# Patient Record
Sex: Female | Born: 1980 | Race: White | Hispanic: No | Marital: Single | Smoking: Current every day smoker
Health system: Southern US, Community
[De-identification: ages and names within clinical notes are randomized; demographics above are authoritative.]

## PROBLEM LIST (undated history)

## (undated) DIAGNOSIS — J45909 Unspecified asthma, uncomplicated: Secondary | ICD-10-CM

## (undated) HISTORY — PX: CHOLECYSTECTOMY: SHX55

## (undated) HISTORY — PX: LAPAROSCOPIC GASTRIC SLEEVE RESECTION: SHX5895

---

## 2013-06-24 ENCOUNTER — Emergency Department: Payer: Self-pay | Admitting: Emergency Medicine

## 2013-06-24 LAB — CBC
HCT: 43.5 % (ref 35.0–47.0)
HGB: 14.8 g/dL (ref 12.0–16.0)
MCH: 30.2 pg (ref 26.0–34.0)
MCHC: 33.9 g/dL (ref 32.0–36.0)
MCV: 89 fL (ref 80–100)
Platelet: 280 10*3/uL (ref 150–440)
RBC: 4.89 10*6/uL (ref 3.80–5.20)
RDW: 13.2 % (ref 11.5–14.5)
WBC: 8.8 10*3/uL (ref 3.6–11.0)

## 2013-06-24 LAB — BASIC METABOLIC PANEL
ANION GAP: 4 — AB (ref 7–16)
BUN: 11 mg/dL (ref 7–18)
CO2: 29 mmol/L (ref 21–32)
CREATININE: 0.64 mg/dL (ref 0.60–1.30)
Calcium, Total: 9 mg/dL (ref 8.5–10.1)
Chloride: 108 mmol/L — ABNORMAL HIGH (ref 98–107)
EGFR (African American): >60
Glucose: 88 mg/dL (ref 65–99)
OSMOLALITY: 280 (ref 275–301)
Potassium: 4.3 mmol/L (ref 3.5–5.1)
Sodium: 141 mmol/L (ref 136–145)

## 2013-06-24 LAB — URINALYSIS, COMPLETE
Bacteria: NONE SEEN
GLUCOSE, UR: NEGATIVE mg/dL (ref 0–75)
Leukocyte Esterase: NEGATIVE
NITRITE: NEGATIVE
PH: 7 (ref 4.5–8.0)
Protein: NEGATIVE
RBC,UR: 2 /HPF (ref 0–5)
SPECIFIC GRAVITY: 1.023 (ref 1.003–1.030)
Squamous Epithelial: 19

## 2013-10-31 ENCOUNTER — Emergency Department: Payer: Self-pay | Admitting: Emergency Medicine

## 2013-10-31 LAB — CBC WITH DIFFERENTIAL/PLATELET
Basophil #: 0.1 10*3/uL (ref 0.0–0.1)
Basophil %: 1.9 %
EOS PCT: 1.4 %
Eosinophil #: 0.1 10*3/uL (ref 0.0–0.7)
HCT: 44.5 % (ref 35.0–47.0)
HGB: 14.7 g/dL (ref 12.0–16.0)
LYMPHS ABS: 1.6 10*3/uL (ref 1.0–3.6)
Lymphocyte %: 21.1 %
MCH: 30.1 pg (ref 26.0–34.0)
MCHC: 33.1 g/dL (ref 32.0–36.0)
MCV: 91 fL (ref 80–100)
MONOS PCT: 8 %
Monocyte #: 0.6 x10 3/mm (ref 0.2–0.9)
NEUTROS ABS: 5.2 10*3/uL (ref 1.4–6.5)
NEUTROS PCT: 67.6 %
Platelet: 239 10*3/uL (ref 150–440)
RBC: 4.89 10*6/uL (ref 3.80–5.20)
RDW: 14.8 % — AB (ref 11.5–14.5)
WBC: 7.7 10*3/uL (ref 3.6–11.0)

## 2013-10-31 LAB — COMPREHENSIVE METABOLIC PANEL
ANION GAP: 8 (ref 7–16)
Albumin: 3.6 g/dL (ref 3.4–5.0)
Alkaline Phosphatase: 72 U/L
BUN: 12 mg/dL (ref 7–18)
Bilirubin,Total: 0.6 mg/dL (ref 0.2–1.0)
Calcium, Total: 9.2 mg/dL (ref 8.5–10.1)
Chloride: 105 mmol/L (ref 98–107)
Co2: 29 mmol/L (ref 21–32)
Creatinine: 0.84 mg/dL (ref 0.60–1.30)
EGFR (African American): >60
EGFR (Non-African Amer.): >60
Glucose: 86 mg/dL (ref 65–99)
Osmolality: 282 (ref 275–301)
Potassium: 4.1 mmol/L (ref 3.5–5.1)
SGOT(AST): 16 U/L (ref 15–37)
SGPT (ALT): 19 U/L
Sodium: 142 mmol/L (ref 136–145)
Total Protein: 7.3 g/dL (ref 6.4–8.2)

## 2013-10-31 LAB — URINALYSIS, COMPLETE
BACTERIA: NONE SEEN
Bilirubin,UR: NEGATIVE
Blood: NEGATIVE
Glucose,UR: NEGATIVE mg/dL (ref 0–75)
Ketone: NEGATIVE
Leukocyte Esterase: NEGATIVE
Nitrite: NEGATIVE
PROTEIN: NEGATIVE
Ph: 6 (ref 4.5–8.0)
RBC,UR: 1 /HPF (ref 0–5)
Specific Gravity: 1.021 (ref 1.003–1.030)
Squamous Epithelial: 4
WBC UR: 2 /HPF (ref 0–5)

## 2013-10-31 LAB — LIPASE, BLOOD: Lipase: 64 U/L — ABNORMAL LOW (ref 73–393)

## 2014-05-14 ENCOUNTER — Emergency Department: Payer: Self-pay | Admitting: Emergency Medicine

## 2014-10-16 ENCOUNTER — Encounter: Payer: Self-pay | Admitting: Emergency Medicine

## 2014-10-16 DIAGNOSIS — Z720 Tobacco use: Secondary | ICD-10-CM | POA: Insufficient documentation

## 2014-10-16 DIAGNOSIS — M79632 Pain in left forearm: Secondary | ICD-10-CM | POA: Diagnosis present

## 2014-10-16 NOTE — ED Notes (Signed)
Pt presents to ER alert and in NAD. Pt has redness and slight swelling noted to left forearm. pt states she had a cyst for several months and she was pushing on it and it "went down" several days ago and is now having pain to area.

## 2014-10-17 ENCOUNTER — Emergency Department
Admission: EM | Admit: 2014-10-17 | Discharge: 2014-10-17 | Discharge status: Against Medical Advice | Payer: Medicaid Other | Attending: Emergency Medicine | Admitting: Emergency Medicine

## 2014-12-11 ENCOUNTER — Emergency Department
Admission: EM | Admit: 2014-12-11 | Discharge: 2014-12-11 | Disposition: A | Discharge status: Home / Self Care | Payer: Medicaid Other | Attending: Emergency Medicine | Admitting: Emergency Medicine

## 2014-12-11 ENCOUNTER — Encounter: Payer: Self-pay | Admitting: Emergency Medicine

## 2014-12-11 DIAGNOSIS — K029 Dental caries, unspecified: Secondary | ICD-10-CM | POA: Diagnosis not present

## 2014-12-11 DIAGNOSIS — K047 Periapical abscess without sinus: Secondary | ICD-10-CM | POA: Diagnosis not present

## 2014-12-11 DIAGNOSIS — Z720 Tobacco use: Secondary | ICD-10-CM | POA: Diagnosis not present

## 2014-12-11 DIAGNOSIS — K088 Other specified disorders of teeth and supporting structures: Secondary | ICD-10-CM | POA: Insufficient documentation

## 2014-12-11 DIAGNOSIS — K0889 Other specified disorders of teeth and supporting structures: Secondary | ICD-10-CM

## 2014-12-11 MED ORDER — HYDROCODONE-ACETAMINOPHEN 5-325 MG PO TABS: 1.0000 | ORAL_TABLET | ORAL | Status: DC | PRN

## 2014-12-11 MED ORDER — LIDOCAINE HCL (PF) 1 % IJ SOLN
2.0000 mL | Freq: Once | INTRAMUSCULAR | Status: AC
  Administered 2014-12-11: 2 mL
  Filled 2014-12-11: qty 5

## 2014-12-11 MED ORDER — BUPIVACAINE HCL (PF) 0.5 % IJ SOLN
10.0000 mL | Freq: Once | INTRAMUSCULAR | Status: AC
  Administered 2014-12-11: 10 mL
  Filled 2014-12-11: qty 30

## 2014-12-11 MED ORDER — AMOXICILLIN-POT CLAVULANATE 875-125 MG PO TABS: 1.0000 | ORAL_TABLET | Freq: Two times a day (BID) | ORAL | Status: DC

## 2014-12-11 NOTE — Discharge Instructions (Signed)

## 2014-12-11 NOTE — ED Notes (Signed)
Pt had dental surgery 10 days ago, using Abx,  But swelling and pain has increased.

## 2014-12-11 NOTE — ED Provider Notes (Signed)
CSN: [id number]     Arrival date & time 12/11/14  1657 History   First MD Initiated Contact with Patient 12/11/14 1740     Chief Complaint  Patient presents with  . Dental Pain     (Consider location/radiation/quality/duration/timing/severity/associated sxs/prior Treatment) Patient is a 34 y.o. female presenting with tooth pain.  Dental Pain Associated symptoms: no congestion, no fever and no headaches     Mercedes Mendoza is a 34 y.o. female presenting with dental pain, swelling, and bleeding. Patient had tooth #31 extracted last Wednesday 12/02/2014 at [facility]. She tolerated the procedure well, but states that "they had to do a lot to get the tooth out." Six stitches were placed during the procedure. She was given a couple days worth of Norco and the pain was well tolerated. She was also given a 10 day course of Amoxicillin TID.   2-3 days ago she noticed swelling to the right side of her face near the jaw. She has also experienced more pain in the last 1-2 days at the site of extraction and along her right jaw. Pain is described as 8/10 throbbing pain made worse with eating, drinking, and touching. The pain is only moderately improved with 800 mg Ibuprofen, which she has taken daily for the last 3 days. She had minor bleeding this morning from the site of extraction. Patient is otherwise feeling healthy and denies fever, chills, SOB, and chest pain.   History reviewed. No pertinent past medical history. Past Surgical History  Procedure Laterality Date  . Cesarean section    . Cholecystectomy    . Laparoscopic gastric sleeve resection     No family history on file. Social History  Substance Use Topics  . Smoking status: Current Every Day Smoker -- 0.50 packs/day    Types: Cigarettes  . Smokeless tobacco: None  . Alcohol Use: No   OB History    No data available     Review of Systems  Constitutional: Positive for fatigue (not sleeping well due to the pain).  Negative for fever, chills, diaphoresis and activity change.  HENT: Positive for dental problem. Negative for congestion and rhinorrhea.   Eyes: Negative for discharge.  Respiratory: Negative for chest tightness and shortness of breath.   Cardiovascular: Negative for chest pain.  Gastrointestinal: Negative for nausea, vomiting, abdominal pain, diarrhea and constipation.  Genitourinary: Negative for dysuria and difficulty urinating.  Musculoskeletal: Negative for myalgias and neck stiffness.  Skin: Negative for color change and rash.  Neurological: Negative for dizziness, light-headedness and headaches.      Allergies  Codeine and Tramadol  Home Medications   Prior to Admission medications   Not on File   BP 128/68 mmHg  Pulse 80  Temp(Src) 98.2 F (36.8 C) (Oral)  Resp 16  Ht 5\' 7"  (1.702 m)  Wt 220 lb (99.791 kg)  BMI 34.45 kg/m2  SpO2 99%  LMP 11/10/2014 (Approximate) Physical Exam  Constitutional: She is oriented to person, place, and time. She appears well-developed and well-nourished. No distress.  HENT:  Head: Normocephalic and atraumatic.  Right Ear: External ear normal.  Left Ear: External ear normal.  Nose: Nose normal.  Mouth/Throat: Uvula is midline and oropharynx is clear and moist. No oral lesions. No trismus in the jaw. Normal dentition. Dental caries present. No dental abscesses or uvula swelling.    Teeth #30 and #31 extracted. Pain on palpation of right lower jaw and inside right side of mouth. Small amount of white  discharge noted at area of extraction without blood. Right lower face is mildly swollen without erythema or ecchymosis.   Eyes: Conjunctivae and EOM are normal.  Neck: Normal range of motion. Neck supple.  Cardiovascular: Normal rate and regular rhythm.  Exam reveals no gallop and no friction rub.   No murmur heard. Pulmonary/Chest: Effort normal and breath sounds normal. No respiratory distress.  Lymphadenopathy:    She has no cervical  adenopathy.  Neurological: She is alert and oriented to person, place, and time.  Skin: Skin is warm and dry. She is not diaphoretic.  Psychiatric: She has a normal mood and affect. Her behavior is normal. Thought content normal.  Nursing note and vitals reviewed.   ED Course  Procedures (including critical care time) NERVE BLOCK Performed by: Patience Musca Consent: Verbal consent obtained. Required items: required blood products, implants, devices, and special equipment available Time out: Immediately prior to procedure a "time out" was called to verify the correct patient, procedure, equipment, support staff and site/side marked as required.  Indication: Right-sided facial pain  Nerve block body site: Right inferior alveolar nerve   Preparation: Patient was prepped and draped in the usual sterile fashion. Needle gauge: 25gauge  1.5 inch  Location technique: anatomical landmarks  Local anesthetic: There are 0.5% bupivacaine, 5 cc. 1% lidocaine, 5 cc   Anesthetic total: 10 ml  Outcome: pain improved Patient tolerance: Patient tolerated the procedure well with no immediate complications. Pain immediately improved.  Labs Review Labs Reviewed - No data to display  Imaging Review No results found. I have personally reviewed and evaluated these images and lab results as part of my medical decision-making.   EKG Interpretation None      MDM   Final diagnoses:  Pain, dental  Dental infection    34 year old female with right sided tooth pain and facial swelling. No active bleeding. No visible abscess or fluctuance. Patient given a right inferior alveolar nerve block which significantly improved her right lower jaw pain. Augmentin twice a day 7 days. Follow-up with [facility] dental clinic first of next week. Return to ER for any worsening symptoms urgent changes in her health.       Evon Slack, PA-C 12/11/14 1834  Minna Antis, MD 12/11/14 [phone]

## 2014-12-11 NOTE — ED Notes (Signed)
Patient presents to the ED with pain, swelling, and bleeding in an area where she had a tooth pulled last Wednesday.  Patient states she has been taking amoxicillin since the procedure but pain and swelling have been getting worse the past two to three days.

## 2014-12-14 ENCOUNTER — Emergency Department
Admission: EM | Admit: 2014-12-14 | Discharge: 2014-12-14 | Disposition: A | Discharge status: Home / Self Care | Payer: Medicaid Other | Attending: Emergency Medicine | Admitting: Emergency Medicine

## 2014-12-14 DIAGNOSIS — K088 Other specified disorders of teeth and supporting structures: Secondary | ICD-10-CM | POA: Insufficient documentation

## 2014-12-14 DIAGNOSIS — K0889 Other specified disorders of teeth and supporting structures: Secondary | ICD-10-CM

## 2014-12-14 DIAGNOSIS — Z720 Tobacco use: Secondary | ICD-10-CM | POA: Insufficient documentation

## 2014-12-14 MED ORDER — HYDROCODONE-ACETAMINOPHEN 5-325 MG PO TABS
1.0000 | ORAL_TABLET | Freq: Once | ORAL | Status: AC
  Administered 2014-12-14: 1 via ORAL
  Filled 2014-12-14: qty 1

## 2014-12-14 MED ORDER — HYDROCODONE-ACETAMINOPHEN 5-325 MG PO TABS: 1.0000 | ORAL_TABLET | ORAL | Status: DC | PRN

## 2014-12-14 NOTE — Discharge Instructions (Signed)

## 2014-12-14 NOTE — ED Provider Notes (Signed)
[facility] Emergency Department Provider Note  ____________________________________________  Time seen: On arrival  I have reviewed the triage vital signs and the nursing notes.   HISTORY  Chief Complaint Dental Pain    HPI Mercedes Mendoza is a 34 y.o. female who presents with dental pain. She had a procedure done at [facility] on 12/02/2014 where she had a tooth extracted. She has had continued discomfort in the area. She has a follow-up with her dentist's next week. She denies fevers chills. She started on antibiotics. She has run out of pain medication requests refill. No difficulty swallowing, no intraoral swelling, no neck pain    No past medical history on file.  There are no active problems to display for this patient.   Past Surgical History  Procedure Laterality Date  . Cesarean section    . Cholecystectomy    . Laparoscopic gastric sleeve resection      Current Outpatient Rx  Name  Route  Sig  Dispense  Refill  . amoxicillin-clavulanate (AUGMENTIN) 875-125 MG per tablet   Oral   Take 1 tablet by mouth every 12 (twelve) hours. 7 days   14 tablet   0   . HYDROcodone-acetaminophen (NORCO/VICODIN) 5-325 MG per tablet   Oral   Take 1 tablet by mouth every 4 (four) hours as needed for moderate pain.   20 tablet   0     Allergies Codeine and Tramadol  No family history on file.  Social History Social History  Substance Use Topics  . Smoking status: Current Every Day Smoker -- 0.50 packs/day    Types: Cigarettes  . Smokeless tobacco: Not on file  . Alcohol Use: No    Review of Systems  Constitutional: Negative for fever. Eyes: Negative for visual changes. ENT: Negative for sore throat   Genitourinary: Negative for dysuria. Musculoskeletal: Negative for back pain. Skin: Negative for rash. Neurological: Negative for headaches or focal weakness   ____________________________________________   PHYSICAL EXAM:  VITAL  SIGNS: ED Triage Vitals  Enc Vitals Group     BP 12/14/14 1738 132/69 mmHg     Pulse Rate 12/14/14 1738 71     Resp --      Temp 12/14/14 1738 98.1 F (36.7 C)     Temp Source 12/14/14 1738 Oral     SpO2 12/14/14 1738 98 %     Weight --      Height --      Head Cir --      Peak Flow --      Pain Score 12/14/14 1812 7     Pain Loc --      Pain Edu? --      Excl. in GC? --      Constitutional: Alert and oriented. Well appearing and in no distress. Eyes: Conjunctivae are normal.  ENT   Head: Normocephalic and atraumatic.   Mouth/Throat: Mucous membranes are moist. No erythema or abscess surrounding extracted tooth. No discharge, mild tenderness to palpation. Normal oropharynx Cardiovascular: Normal rate, regular rhythm.  Respiratory: Normal respiratory effort without tachypnea nor retractions.  Gastrointestinal: Soft and non-tender in all quadrants. No distention. There is no CVA tenderness. Musculoskeletal: Nontender with normal range of motion in all extremities. Neurologic:  Normal speech and language. No gross focal neurologic deficits are appreciated. Skin:  Skin is warm, dry and intact. No rash noted. Psychiatric: Mood and affect are normal. Patient exhibits appropriate insight and judgment.  ____________________________________________    LABS (pertinent positives/negatives)  Labs Reviewed - No data to display  ____________________________________________     ____________________________________________    RADIOLOGY I have personally reviewed any xrays that were ordered on this patient: None  ____________________________________________   PROCEDURES  Procedure(s) performed: none   ____________________________________________   INITIAL IMPRESSION / ASSESSMENT AND PLAN / ED COURSE  Pertinent labs & imaging results that were available during my care of the patient were reviewed by me and considered in my medical decision making (see chart for  details).  Patient well-appearing and in no distress. Afebrile, relatively benign exam. She requires follow-up with her dentist. She is on antibiotics. I'll refill her pain medications I've asked her to follow-up closely this week. Return precautions discussed  ____________________________________________   FINAL CLINICAL IMPRESSION(S) / ED DIAGNOSES  Final diagnoses:  Pain, dental     Jene Every, MD 12/14/14 [phone]

## 2014-12-23 ENCOUNTER — Emergency Department
Admission: EM | Admit: 2014-12-23 | Discharge: 2014-12-23 | Disposition: A | Discharge status: Home / Self Care | Payer: Medicaid Other | Attending: Emergency Medicine | Admitting: Emergency Medicine

## 2014-12-23 DIAGNOSIS — K088 Other specified disorders of teeth and supporting structures: Secondary | ICD-10-CM | POA: Diagnosis present

## 2014-12-23 DIAGNOSIS — K0889 Other specified disorders of teeth and supporting structures: Secondary | ICD-10-CM

## 2014-12-23 DIAGNOSIS — Z720 Tobacco use: Secondary | ICD-10-CM | POA: Insufficient documentation

## 2014-12-23 MED ORDER — SODIUM CHLORIDE 0.9 % IV BOLUS (SEPSIS): 1000.0000 mL | Freq: Once | INTRAVENOUS | Status: DC

## 2014-12-23 MED ORDER — HYDROCODONE-ACETAMINOPHEN 5-325 MG PO TABS: 1.0000 | ORAL_TABLET | ORAL | Status: DC | PRN

## 2014-12-23 MED ORDER — NAPROXEN 500 MG PO TBEC: 500.0000 mg | DELAYED_RELEASE_TABLET | Freq: Two times a day (BID) | ORAL | Status: DC

## 2014-12-23 NOTE — ED Notes (Signed)
Pt reports that she had a tooth pulled 2 weeks ago and apparently still has stitches. She states that she ate a chip tonight and thinks stitches may have dislodged. Some bleeding in mouth.

## 2014-12-23 NOTE — Discharge Instructions (Signed)

## 2014-12-23 NOTE — ED Provider Notes (Signed)
[facility] Emergency Department Provider Note  ____________________________________________  Time seen: Approximately 5:26 PM  I have reviewed the triage vital signs and the nursing notes.   HISTORY  Chief Complaint Dental Pain    HPI Mercedes Mendoza is a 34 y.o. female who presents with dental pain. She had a procedure done at [facility] on 12/02/2014 where she had a tooth extracted. She has had continued discomfort in the area. She has a follow-up with her dentist's next week on 12/30/2014.Marland Kitchen She denies fevers chills. She has recently finished her antibiotics. She has run out of pain medication requests refill. No difficulty swallowing, no intraoral swelling, no neck pain. Patient was seen here on 9/16 and 9/19 for the same.Previous notes and [place] controlled substance registry reviewed.   No past medical history on file.  There are no active problems to display for this patient.   Past Surgical History  Procedure Laterality Date  . Cesarean section    . Cholecystectomy    . Laparoscopic gastric sleeve resection      Current Outpatient Rx  Name  Route  Sig  Dispense  Refill  . HYDROcodone-acetaminophen (NORCO) 5-325 MG tablet   Oral   Take 1-2 tablets by mouth every 4 (four) hours as needed for moderate pain.   15 tablet   0   . naproxen (EC NAPROSYN) 500 MG EC tablet   Oral   Take 1 tablet (500 mg total) by mouth 2 (two) times daily with a meal.   60 tablet   0     Allergies Codeine and Tramadol  No family history on file.  Social History Social History  Substance Use Topics  . Smoking status: Current Every Day Smoker -- 0.50 packs/day    Types: Cigarettes  . Smokeless tobacco: Not on file  . Alcohol Use: No    Review of Systems Constitutional: No fever/chills Eyes: No visual changes. ENT: Positive for right up dental gum pain. Cardiovascular: Denies chest pain. Respiratory: Denies shortness of breath. Gastrointestinal:  No abdominal pain.  No nausea, no vomiting.  No diarrhea.  No constipation. Genitourinary: Negative for dysuria. Musculoskeletal: Negative for back pain. Skin: Negative for rash. Neurological: Negative for headaches, focal weakness or numbness.  10-point ROS otherwise negative.  ____________________________________________   PHYSICAL EXAM:  VITAL SIGNS: ED Triage Vitals  Enc Vitals Group     BP 12/23/14 1657 139/99 mmHg     Pulse Rate 12/23/14 1657 85     Resp --      Temp 12/23/14 1657 98.4 F (36.9 C)     Temp Source 12/23/14 1657 Oral     SpO2 12/23/14 1657 99 %     Weight 12/23/14 1657 220 lb (99.791 kg)     Height 12/23/14 1657  (1.702 m)     Head Cir --      Peak Flow --      Pain Score 12/23/14 1657 6     Pain Loc --      Pain Edu? --      Excl. in GC? --     Constitutional: Alert and oriented. Well appearing and in no acute distress. Eyes: Conjunctivae are normal. PERRL. EOMI. Head: Atraumatic. Nose: No congestion/rhinnorhea. Mouth/Throat: Mucous membranes are moist.  Oropharynx non-erythematous. No evidence of infection. No active bleeding noted. Tenderness to the area noted. Neck: No stridor.   Cardiovascular: Normal rate, regular rhythm. Grossly normal heart sounds.  Good peripheral circulation. Respiratory: Normal respiratory effort.  No retractions. Lungs CTAB. Gastrointestinal: Soft and nontender. No distention. No abdominal bruits. No CVA tenderness. Musculoskeletal: No lower extremity tenderness nor edema.  No joint effusions. Neurologic:  Normal speech and language. No gross focal neurologic deficits are appreciated. No gait instability. Skin:  Skin is warm, dry and intact. No rash noted. Psychiatric: Mood and affect are normal. Speech and behavior are normal.  ____________________________________________   LABS (all labs ordered are listed, but only abnormal results are displayed)  Labs Reviewed - No data to  display ____________________________________________   PROCEDURES  Procedure(s) performed: None  Critical Care performed: No  ____________________________________________   INITIAL IMPRESSION / ASSESSMENT AND PLAN / ED COURSE  Pertinent labs & imaging results that were available during my care of the patient were reviewed by me and considered in my medical decision making (see chart for details).  Continued dental pain. Encouraged patient to follow up with her dentist next week on October 5's noted above. Patient instructed that we would no longer be able to give her controlled drug for pain since this is becoming chronic. Rx given for Norco 5/325 #20. Viscous lidocaine to be applied by Q-tip to the gum area. ____________________________________________   FINAL CLINICAL IMPRESSION(S) / ED DIAGNOSES  Final diagnoses:  Pain, dental      Evangeline Dakin, PA-C 12/23/14 1740  Sharyn Creamer, MD 01/01/15 [phone]

## 2015-02-15 ENCOUNTER — Emergency Department: Payer: No Typology Code available for payment source

## 2015-02-15 ENCOUNTER — Emergency Department
Admission: EM | Admit: 2015-02-15 | Discharge: 2015-02-15 | Disposition: A | Discharge status: Home / Self Care | Payer: No Typology Code available for payment source | Attending: Emergency Medicine | Admitting: Emergency Medicine

## 2015-02-15 ENCOUNTER — Encounter: Payer: Self-pay | Admitting: Medical Oncology

## 2015-02-15 DIAGNOSIS — Y9389 Activity, other specified: Secondary | ICD-10-CM | POA: Diagnosis not present

## 2015-02-15 DIAGNOSIS — S20219A Contusion of unspecified front wall of thorax, initial encounter: Secondary | ICD-10-CM

## 2015-02-15 DIAGNOSIS — Y998 Other external cause status: Secondary | ICD-10-CM | POA: Insufficient documentation

## 2015-02-15 DIAGNOSIS — S301XXA Contusion of abdominal wall, initial encounter: Secondary | ICD-10-CM

## 2015-02-15 DIAGNOSIS — Y92410 Unspecified street and highway as the place of occurrence of the external cause: Secondary | ICD-10-CM | POA: Insufficient documentation

## 2015-02-15 DIAGNOSIS — Z3202 Encounter for pregnancy test, result negative: Secondary | ICD-10-CM | POA: Diagnosis not present

## 2015-02-15 DIAGNOSIS — S299XXA Unspecified injury of thorax, initial encounter: Secondary | ICD-10-CM | POA: Diagnosis present

## 2015-02-15 DIAGNOSIS — S40011A Contusion of right shoulder, initial encounter: Secondary | ICD-10-CM | POA: Diagnosis not present

## 2015-02-15 DIAGNOSIS — F17210 Nicotine dependence, cigarettes, uncomplicated: Secondary | ICD-10-CM | POA: Insufficient documentation

## 2015-02-15 HISTORY — DX: Unspecified asthma, uncomplicated: J45.909

## 2015-02-15 LAB — URINALYSIS COMPLETE WITH MICROSCOPIC (ARMC ONLY)
Bilirubin Urine: NEGATIVE
Glucose, UA: NEGATIVE mg/dL
HGB URINE DIPSTICK: NEGATIVE
Leukocytes, UA: NEGATIVE
Nitrite: NEGATIVE
PH: 5 (ref 5.0–8.0)
PROTEIN: NEGATIVE mg/dL
Specific Gravity, Urine: 1.029 (ref 1.005–1.030)

## 2015-02-15 LAB — COMPREHENSIVE METABOLIC PANEL
ALBUMIN: 4.2 g/dL (ref 3.5–5.0)
ALT: 12 U/L — ABNORMAL LOW (ref 14–54)
ANION GAP: 7 (ref 5–15)
AST: 13 U/L — AB (ref 15–41)
Alkaline Phosphatase: 43 U/L (ref 38–126)
BUN: 18 mg/dL (ref 6–20)
CHLORIDE: 105 mmol/L (ref 101–111)
CO2: 28 mmol/L (ref 22–32)
Calcium: 9.3 mg/dL (ref 8.9–10.3)
Creatinine, Ser: 0.6 mg/dL (ref 0.44–1.00)
GFR calc Af Amer: >60 mL/min (ref >60)
GFR calc non Af Amer: >60 mL/min (ref >60)
GLUCOSE: 97 mg/dL (ref 65–99)
POTASSIUM: 3.8 mmol/L (ref 3.5–5.1)
Sodium: 140 mmol/L (ref 135–145)
Total Bilirubin: 0.9 mg/dL (ref 0.3–1.2)
Total Protein: 6.6 g/dL (ref 6.5–8.1)

## 2015-02-15 LAB — CBC WITH DIFFERENTIAL/PLATELET
Basophils Absolute: 0.1 10*3/uL (ref 0–0.1)
Basophils Relative: 1 %
EOS PCT: 1 %
Eosinophils Absolute: 0.1 10*3/uL (ref 0–0.7)
HEMATOCRIT: 43.7 % (ref 35.0–47.0)
Hemoglobin: 14.9 g/dL (ref 12.0–16.0)
LYMPHS ABS: 2 10*3/uL (ref 1.0–3.6)
LYMPHS PCT: 25 %
MCH: 30.7 pg (ref 26.0–34.0)
MCHC: 34.1 g/dL (ref 32.0–36.0)
MCV: 89.9 fL (ref 80.0–100.0)
MONO ABS: 0.5 10*3/uL (ref 0.2–0.9)
Monocytes Relative: 6 %
NEUTROS ABS: 5.2 10*3/uL (ref 1.4–6.5)
Neutrophils Relative %: 67 %
PLATELETS: 221 10*3/uL (ref 150–440)
RBC: 4.86 MIL/uL (ref 3.80–5.20)
RDW: 13 % (ref 11.5–14.5)
WBC: 7.8 10*3/uL (ref 3.6–11.0)

## 2015-02-15 LAB — POCT PREGNANCY, URINE: Preg Test, Ur: NEGATIVE

## 2015-02-15 MED ORDER — DIAZEPAM 5 MG/ML IJ SOLN
5.0000 mg | Freq: Once | INTRAMUSCULAR | Status: AC
  Administered 2015-02-15: 5 mg via INTRAVENOUS
  Filled 2015-02-15: qty 2

## 2015-02-15 MED ORDER — CYCLOBENZAPRINE HCL 10 MG PO TABS: 10.0000 mg | ORAL_TABLET | Freq: Three times a day (TID) | ORAL | Status: AC | PRN

## 2015-02-15 MED ORDER — HYDROCODONE-ACETAMINOPHEN 5-325 MG PO TABS: 1.0000 | ORAL_TABLET | ORAL | Status: AC | PRN

## 2015-02-15 MED ORDER — IBUPROFEN 800 MG PO TABS: 800.0000 mg | ORAL_TABLET | Freq: Three times a day (TID) | ORAL | Status: AC | PRN

## 2015-02-15 MED ORDER — HYDROMORPHONE HCL 1 MG/ML IJ SOLN
0.5000 mg | Freq: Once | INTRAMUSCULAR | Status: AC
  Administered 2015-02-15: 0.5 mg via INTRAVENOUS
  Filled 2015-02-15: qty 1

## 2015-02-15 MED ORDER — IOHEXOL 300 MG/ML  SOLN
150.0000 mL | Freq: Once | INTRAMUSCULAR | Status: AC | PRN
  Administered 2015-02-15: 125 mL via INTRAVENOUS
  Filled 2015-02-15: qty 150
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
## 2015-02-15 NOTE — Discharge Instructions (Signed)
Chest Contusion A chest contusion is a deep bruise on your chest area. Contusions are the result of an injury that caused bleeding under the skin. A chest contusion may involve bruising of the skin, muscles, or ribs. The contusion may turn blue, purple, or yellow. Minor injuries will give you a painless contusion, but more severe contusions may stay painful and swollen for a few weeks. CAUSES  A contusion is usually caused by a blow, trauma, or direct force to an area of the body. SYMPTOMS   Swelling and redness of the injured area.  Discoloration of the injured area.  Tenderness and soreness of the injured area.  Pain. DIAGNOSIS  The diagnosis can be made by taking a history and performing a physical exam. An X-ray, CT scan, or MRI may be needed to determine if there were any associated injuries, such as broken bones (fractures) or internal injuries. TREATMENT  Often, the best treatment for a chest contusion is resting, icing, and applying cold compresses to the injured area. Deep breathing exercises may be recommended to reduce the risk of pneumonia. Over-the-counter medicines may also be recommended for pain control. HOME CARE INSTRUCTIONS   Put ice on the injured area.  Put ice in a plastic bag.  Place a towel between your skin and the bag.  Leave the ice on for 15-20 minutes, 03-04 times a day.  Only take over-the-counter or prescription medicines as directed by your caregiver. Your caregiver may recommend avoiding anti-inflammatory medicines (aspirin, ibuprofen, and naproxen) for 48 hours because these medicines may increase bruising.  Rest the injured area.  Perform deep-breathing exercises as directed by your caregiver.  Stop smoking if you smoke.  Do not lift objects over 5 pounds (2.3 kg) for 3 days or longer if recommended by your caregiver. SEEK IMMEDIATE MEDICAL CARE IF:   You have increased bruising or swelling.  You have pain that is getting worse.  You have  difficulty breathing.  You have dizziness, weakness, or fainting.  You have blood in your urine or stool.  You cough up or vomit blood.  Your swelling or pain is not relieved with medicines. MAKE SURE YOU:   Understand these instructions.  Will watch your condition.  Will get help right away if you are not doing well or get worse.   This information is not intended to replace advice given to you by your health care provider. Make sure you discuss any questions you have with your health care provider.   Document Released: 12/06/2000 Document Revised: 12/06/2011 Document Reviewed: 09/04/2011 Elsevier Interactive Patient Education 2016 [employer]  Blunt Abdominal Trauma Blunt abdominal trauma is a type of injury that involves damage to the abdominal wall or to abdominal organs, such as the liver or spleen. The damage can involve bruising, tearing, or a rupture. This type of injury does not involve a puncture of the skin. Blunt abdominal trauma can range from mild to severe. In some cases it can lead to a severe abdominal inflammation (peritonitis), severe bleeding, and a dangerous drop in blood pressure. CAUSES This injury is caused by a hard, direct hit to the abdomen. It can happen after:  A motor vehicle accident.  Being kicked or punched in the abdomen.  Falling from a significant height. RISK FACTORS This injury is more likely to happen in people who:  Play contact sports.  Work in a job in which falls or injuries are more likely, such as in Holiday representativeconstruction. SYMPTOMS The main symptom of this  condition is pain in the abdomen. Other symptoms depend on the type and location of the injury. They can include:  Abdominal pain that spreads to the the back or shoulder.  Bruising.  Swelling.  Pain when pressing on the abdomen.  Blood in the urine.  Weakness.  Confusion.  Loss of consciousness.  Pale, dusky, cool, or sweaty skin.  Vomiting blood.  Bloody stool or  bleeding from the rectum.  Trouble breathing. Symptoms of this injury can develop suddenly or slowly.  DIAGNOSIS This injury is diagnosed based on your symptoms and a physical exam. You may also have tests, including:  Blood tests.  Urine tests.  Imaging tests, such as:  A CT scan and ultrasound of your abdomen.  X-rays of your chest and abdomen.  A test in which a tube is used to flush your abdomen with fluid and check for blood (diagnostic peritoneal lavage). TREATMENT Treatment for this injury depends on its type and severity. Treatment options include:  Observation. If the injury is mild, this may be the only treatment needed.  Support of your blood pressure and breathing.  Getting blood, fluids, or medicine through an IV tube.  Antibiotic medicine.  Insertion of tubes into the stomach or bladder.  A blood transfusion.  A procedure to stop bleeding. This involves putting a long, thin tube (catheter) into one of your blood vessels (angiographic embolization).  Surgery to open up your abdomen and control bleeding or repair damage (laparotomy). This may be done if tests suggest that you have peritonitis or bleeding that cannot be controlled with angiographic embolization. HOME CARE INSTRUCTIONS  Take medicines only as directed by your health care provider.  If you were prescribed an antibiotic medicine, finish all of it even if you start to feel better.  Follow your health care provider's instructions about diet and activity restrictions.  Keep all follow-up visits as directed by your health care provider. This is important. SEEK MEDICAL CARE IF:  You continue to have abdominal pain.  Your symptoms return.  You develop new symptoms.  You have blood in your urine or your bowel movements. SEEK IMMEDIATE MEDICAL CARE IF:  You vomit blood.  You have heavy bleeding from your rectum.  You have very bad abdominal pain.  You have trouble breathing.  You have  chest pain.  You have a fever.  You have dizziness.  You pass out.   This information is not intended to replace advice given to you by your health care provider. Make sure you discuss any questions you have with your health care provider.   Document Released: 04/20/2004 Document Revised: 07/28/2014 Document Reviewed: 03/04/2014 Elsevier Interactive Patient Education 2016 [employer]  Tourist information centre managerMotor Vehicle Collision It is common to have multiple bruises and sore muscles after a motor vehicle collision (MVC). These tend to feel worse for the first 24 hours. You may have the most stiffness and soreness over the first several hours. You may also feel worse when you wake up the first morning after your collision. After this point, you will usually begin to improve with each day. The speed of improvement often depends on the severity of the collision, the number of injuries, and the location and nature of these injuries. HOME CARE INSTRUCTIONS  Put ice on the injured area.  Put ice in a plastic bag.  Place a towel between your skin and the bag.  Leave the ice on for 15-20 minutes, 3-4 times a day, or as directed by your health  care provider.  Drink enough fluids to keep your urine clear or pale yellow. Do not drink alcohol.  Take a warm shower or bath once or twice a day. This will increase blood flow to sore muscles.  You may return to activities as directed by your caregiver. Be careful when lifting, as this may aggravate neck or back pain.  Only take over-the-counter or prescription medicines for pain, discomfort, or fever as directed by your caregiver. Do not use aspirin. This may increase bruising and bleeding. SEEK IMMEDIATE MEDICAL CARE IF:  You have numbness, tingling, or weakness in the arms or legs.  You develop severe headaches not relieved with medicine.  You have severe neck pain, especially tenderness in the middle of the back of your neck.  You have changes in bowel or  bladder control.  There is increasing pain in any area of the body.  You have shortness of breath, light-headedness, dizziness, or fainting.  You have chest pain.  You feel sick to your stomach (nauseous), throw up (vomit), or sweat.  You have increasing abdominal discomfort.  There is blood in your urine, stool, or vomit.  You have pain in your shoulder (shoulder strap areas).  You feel your symptoms are getting worse. MAKE SURE YOU:  Understand these instructions.  Will watch your condition.  Will get help right away if you are not doing well or get worse.   This information is not intended to replace advice given to you by your health care provider. Make sure you discuss any questions you have with your health care provider.   Document Released: 03/13/2005 Document Revised: 04/03/2014 Document Reviewed: 08/10/2010 Elsevier Interactive Patient Education [employer]
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
